# Patient Record
Sex: Male | Born: 2000
Health system: Southern US, Community
[De-identification: ages and names within clinical notes are randomized; demographics above are authoritative.]

---
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
## 2020-02-12 DIAGNOSIS — J029 Acute pharyngitis, unspecified: Secondary | ICD-10-CM | POA: Diagnosis not present

## 2020-02-12 DIAGNOSIS — U071 COVID-19: Secondary | ICD-10-CM | POA: Diagnosis not present

## 2021-04-19 ENCOUNTER — Other Ambulatory Visit: Payer: Self-pay

## 2021-04-19 ENCOUNTER — Ambulatory Visit
Admission: EM | Admit: 2021-04-19 | Discharge: 2021-04-19 | Disposition: A | Discharge status: Home / Self Care | Payer: BC Managed Care – PPO | Attending: Physician Assistant | Admitting: Physician Assistant

## 2021-04-19 DIAGNOSIS — J029 Acute pharyngitis, unspecified: Secondary | ICD-10-CM | POA: Insufficient documentation

## 2021-04-19 DIAGNOSIS — J36 Peritonsillar abscess: Secondary | ICD-10-CM | POA: Diagnosis not present

## 2021-04-19 LAB — POCT RAPID STREP A (OFFICE): Rapid Strep A Screen: NEGATIVE

## 2021-04-19 LAB — POCT INFLUENZA A/B
Influenza A, POC: NEGATIVE
Influenza B, POC: NEGATIVE

## 2021-04-19 MED ORDER — AMOXICILLIN-POT CLAVULANATE 875-125 MG PO TABS: 1.0000 | ORAL_TABLET | Freq: Two times a day (BID) | ORAL | 0 refills | Status: AC

## 2021-04-19 MED ORDER — METHYLPREDNISOLONE SODIUM SUCC 125 MG IJ SOLR
80.0000 mg | Freq: Once | INTRAMUSCULAR | Status: AC
  Administered 2021-04-19: 80 mg via INTRAMUSCULAR

## 2021-04-19 MED ORDER — CEFTRIAXONE SODIUM 500 MG IJ SOLR
500.0000 mg | Freq: Once | INTRAMUSCULAR | Status: AC
  Administered 2021-04-19: 500 mg via INTRAMUSCULAR

## 2021-04-19 NOTE — ED Provider Notes (Signed)
[facility]    CSN: [id number] Arrival date & time: 04/19/21  1752      History   Chief Complaint Chief Complaint  Patient presents with   Cough    HPI Adam Lawson is a 20 y.o. male.   Patient here today for evaluation of sore throat, fever, headache and congestion that started 3 days ago.  He reports that he has pain with swallowing.  He does not report treatment for symptoms.  He states he has had strep in the past and symptoms feel similar.  The history is provided by the patient.  Cough Associated symptoms: chills, fever and sore throat   Associated symptoms: no ear pain, no eye discharge and no shortness of breath    History reviewed. No pertinent past medical history.  Patient Active Problem List   Diagnosis Date Noted   Heart murmur on physical examination- resolved August 2019 01/15/2017   Acquired pectus excavatum 01/15/2017   Routine sports examination for healthy child or adolescent 11/11/2015    History reviewed. No pertinent surgical history.     Home Medications    Prior to Admission medications   Medication Sig Start Date End Date Taking? Authorizing Provider  amoxicillin-clavulanate (AUGMENTIN) 875-125 MG tablet Take 1 tablet by mouth every 12 (twelve) hours. 04/19/21  Yes Tomi Bamberger, PA-C    Family History Family History  Problem Relation Age of Onset   Healthy Mother    Atrial fibrillation Father    Healthy Sister    Healthy Brother     Social History Social History   Tobacco Use   Smoking status: Never   Smokeless tobacco: Never  Vaping Use   Vaping Use: Never used  Substance Use Topics   Alcohol use: No   Drug use: No     Allergies   Patient has no known allergies.   Review of Systems Review of Systems  Constitutional:  Positive for chills and fever.  HENT:  Positive for congestion, sore throat and trouble swallowing. Negative for drooling and ear pain.   Eyes:  Negative for discharge and redness.   Respiratory:  Positive for cough. Negative for shortness of breath.   Gastrointestinal:  Negative for abdominal pain, nausea and vomiting.    Physical Exam Triage Vital Signs ED Triage Vitals  Enc Vitals Group     BP 04/19/21 1842 114/67     Pulse Rate 04/19/21 1842 93     Resp 04/19/21 1842 18     Temp 04/19/21 1842 97.9 F (36.6 C)     Temp Source 04/19/21 1842 Oral     SpO2 04/19/21 1842 95 %     Weight --      Height --      Head Circumference --      Peak Flow --      Pain Score 04/19/21 1848 7     Pain Loc --      Pain Edu? --      Excl. in [place]? --    No data found.  Updated Vital Signs BP 114/67 (BP Location: Left Arm)   Pulse 93   Temp 97.9 F (36.6 C) (Oral)   Resp 18   SpO2 95%      Physical Exam Vitals and nursing note reviewed.  Constitutional:      General: He is not in acute distress.    Appearance: Normal appearance. He is not ill-appearing.  HENT:     Head: Normocephalic and atraumatic.  Nose: Congestion (mild) present.     Mouth/Throat:     Mouth: Mucous membranes are moist.     Pharynx: Oropharynx is clear. Posterior oropharyngeal erythema present. No oropharyngeal exudate.     Comments: Left tonsil larger than right with some uvular deviation, concerning for peritonsillar abscess Eyes:     Conjunctiva/sclera: Conjunctivae normal.  Cardiovascular:     Rate and Rhythm: Normal rate and regular rhythm.     Heart sounds: Normal heart sounds. No murmur heard. Pulmonary:     Effort: Pulmonary effort is normal. No respiratory distress.     Breath sounds: Normal breath sounds. No wheezing, rhonchi or rales.  Skin:    General: Skin is warm and dry.  Neurological:     Mental Status: He is alert.  Psychiatric:        Mood and Affect: Mood normal.        Thought Content: Thought content normal.     UC Treatments / Results  Labs (all labs ordered are listed, but only abnormal results are displayed) Labs Reviewed  CULTURE, GROUP A STREP  [facility])  POCT RAPID STREP A (OFFICE)  POCT INFLUENZA A/B    EKG   Radiology No results found.  Procedures Procedures (including critical care time)  Medications Ordered in UC Medications  methylPREDNISolone sodium succinate (SOLU-MEDROL) 125 mg/2 mL injection 80 mg (has no administration in time range)  cefTRIAXone (ROCEPHIN) injection 500 mg (has no administration in time range)    Initial Impression / Assessment and Plan / UC Course  I have reviewed the triage vital signs and the nursing notes.  Pertinent labs & imaging results that were available during my care of the patient were reviewed by me and considered in my medical decision making (see chart for details).  Rapid strep and flu test negative in office.  Will order strep culture.  Upon examination symptoms seem consistent with peritonsillar abscess, will treat with Rocephin and Solu-Medrol injection in office and Augmentin prescribed for oral treatment at home.  Patient is currently maintaining his own secretions without difficulty and is able to swallow and breathe without trouble.  Strongly urged patient should he have any worsening symptoms to report to the emergency department for evaluation.  Patient expresses understanding.  Final Clinical Impressions(s) / UC Diagnoses   Final diagnoses:  Acute pharyngitis, unspecified etiology  Tonsillar abscess     Discharge Instructions      Report to ED with any worsening symptoms.    ED Prescriptions     Medication Sig Dispense Auth. Provider   amoxicillin-clavulanate (AUGMENTIN) 875-125 MG tablet Take 1 tablet by mouth every 12 (twelve) hours. 14 tablet Tomi Bamberger, PA-C      PDMP not reviewed this encounter.   Tomi Bamberger, PA-C 04/19/21 2027

## 2021-04-19 NOTE — ED Triage Notes (Signed)
Pt c/o cough, congestion, sore throat, and fever x3 days. Hx of strep and feels the same.

## 2021-04-19 NOTE — Discharge Instructions (Addendum)
Report to ED with any worsening symptoms.

## 2021-04-20 ENCOUNTER — Ambulatory Visit: Payer: Self-pay

## 2021-04-23 LAB — CULTURE, GROUP A STREP (THRC)

## 2022-05-31 ENCOUNTER — Ambulatory Visit
Admission: EM | Admit: 2022-05-31 | Discharge: 2022-05-31 | Disposition: A | Discharge status: Home / Self Care | Payer: Managed Care, Other (non HMO) | Attending: Internal Medicine | Admitting: Internal Medicine

## 2022-05-31 DIAGNOSIS — R21 Rash and other nonspecific skin eruption: Secondary | ICD-10-CM | POA: Diagnosis not present

## 2022-05-31 MED ORDER — PREDNISONE 20 MG PO TABS: 40.0000 mg | ORAL_TABLET | Freq: Every day | ORAL | 0 refills | Status: AC

## 2022-05-31 NOTE — Discharge Instructions (Signed)
It appears that you are having an allergic reaction.  I have prescribed prednisone to take to decrease this inflammation and reaction.  Please take this with food to avoid stomach upset.  Follow-up if any symptoms persist or worsen.

## 2022-05-31 NOTE — ED Provider Notes (Signed)
Adam Lawson    CSN: [id number] Arrival date & time: 05/31/22  1821      History   Chief Complaint Chief Complaint  Patient presents with   Rash    Entered by patient    HPI Adam Lawson is a 22 y.o. male.   Patient presents with itchy rash that is present to right side of face, chest, abdomen, back that has been present for about a week.  Patient denies any changes to lotions, soaps, detergents, foods, etc.  Patient has used CeraVe lotion with minimal improvement in symptoms.  Denies feelings of throat closing or shortness of breath.   Rash   History reviewed. No pertinent past medical history.  Patient Active Problem List   Diagnosis Date Noted   Heart murmur on physical examination- resolved August 2019 01/15/2017   Acquired pectus excavatum 01/15/2017   Routine sports examination for healthy child or adolescent 11/11/2015    History reviewed. No pertinent surgical history.     Home Medications    Prior to Admission medications   Medication Sig Start Date End Date Taking? Authorizing Provider  predniSONE (DELTASONE) 20 MG tablet Take 2 tablets (40 mg total) by mouth daily for 5 days. 05/31/22 06/05/22 Yes Dayannara Pascal, Michele Rockers, FNP  amoxicillin-clavulanate (AUGMENTIN) 875-125 MG tablet Take 1 tablet by mouth every 12 (twelve) hours. 04/19/21   Francene Finders, PA-C    Family History Family History  Problem Relation Age of Onset   Healthy Mother    Atrial fibrillation Father    Healthy Sister    Healthy Brother     Social History Social History   Tobacco Use   Smoking status: Never   Smokeless tobacco: Never  Vaping Use   Vaping Use: Never used  Substance Use Topics   Alcohol use: No   Drug use: No     Allergies   Patient has no known allergies.   Review of Systems Review of Systems Per HPI  Physical Exam Triage Vital Signs ED Triage Vitals  Enc Vitals Group     BP 05/31/22 1910 127/80     Pulse Rate 05/31/22 1908 77     Resp  05/31/22 1908 16     Temp 05/31/22 1908 98 F (36.7 C)     Temp Source 05/31/22 1908 Oral     SpO2 05/31/22 1908 97 %     Weight --      Height --      Head Circumference --      Peak Flow --      Pain Score 05/31/22 1908 0     Pain Loc --      Pain Edu? --      Excl. in [place]? --    No data found.  Updated Vital Signs BP 127/80 (BP Location: Left Arm)   Pulse 77   Temp 98 F (36.7 C) (Oral)   Resp 16   SpO2 97%   Visual Acuity Right Eye Distance:   Left Eye Distance:   Bilateral Distance:    Right Eye Near:   Left Eye Near:    Bilateral Near:     Physical Exam Constitutional:      General: He is not in acute distress.    Appearance: Normal appearance. He is not toxic-appearing or diaphoretic.  HENT:     Head: Normocephalic and atraumatic.  Eyes:     Extraocular Movements: Extraocular movements intact.     Conjunctiva/sclera: Conjunctivae normal.  Pulmonary:  Effort: Pulmonary effort is normal.  Skin:    Comments: Maculopapular rash present to right side of face.  Rash to chest and back appear to resemble hives.  Neurological:     General: No focal deficit present.     Mental Status: He is alert and oriented to person, place, and time. Mental status is at baseline.  Psychiatric:        Mood and Affect: Mood normal.        Behavior: Behavior normal.        Thought Content: Thought content normal.        Judgment: Judgment normal.      UC Treatments / Results  Labs (all labs ordered are listed, but only abnormal results are displayed) Labs Reviewed - No data to display  EKG   Radiology No results found.  Procedures Procedures (including critical Lawson time)  Medications Ordered in UC Medications - No data to display  Initial Impression / Assessment and Plan / UC Course  I have reviewed the triage vital signs and the nursing notes.  Pertinent labs & imaging results that were available during my Lawson of the patient were reviewed by me and  considered in my medical decision making (see chart for details).     Rash is consistent with allergic reaction versus contact dermatitis.  Will treat with prednisone as there is facial involvement and given how diffuse rash is.  Patient advised to follow-up if symptoms persist or worsen.  No signs of anaphylaxis or shortness of breath on exam.  Patient verbalized understanding and was agreeable with plan. Final Clinical Impressions(s) / UC Diagnoses   Final diagnoses:  Rash and nonspecific skin eruption     Discharge Instructions      It appears that you are having an allergic reaction.  I have prescribed prednisone to take to decrease this inflammation and reaction.  Please take this with food to avoid stomach upset.  Follow-up if any symptoms persist or worsen.    ED Prescriptions     Medication Sig Dispense Auth. Provider   predniSONE (DELTASONE) 20 MG tablet Take 2 tablets (40 mg total) by mouth daily for 5 days. 10 tablet Teodora Medici, [place]      PDMP not reviewed this encounter.   Teodora Medici, [place] 05/31/22 [phone]

## 2022-05-31 NOTE — ED Triage Notes (Signed)
Pt c/o pruritic rash to back, chest, neck, and face that started ~ 1 week ago.
